# Patient Record
Sex: Male | Born: 1995
Health system: Southern US, Community
[De-identification: ages and names within clinical notes are randomized; demographics above are authoritative.]

---
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
## 2016-04-06 DIAGNOSIS — J019 Acute sinusitis, unspecified: Secondary | ICD-10-CM | POA: Diagnosis not present

## 2016-04-06 DIAGNOSIS — J3089 Other allergic rhinitis: Secondary | ICD-10-CM | POA: Diagnosis not present

## 2016-04-12 DIAGNOSIS — F988 Other specified behavioral and emotional disorders with onset usually occurring in childhood and adolescence: Secondary | ICD-10-CM | POA: Diagnosis not present

## 2016-08-30 DIAGNOSIS — F988 Other specified behavioral and emotional disorders with onset usually occurring in childhood and adolescence: Secondary | ICD-10-CM | POA: Diagnosis not present

## 2016-08-30 DIAGNOSIS — Z6820 Body mass index (BMI) 20.0-20.9, adult: Secondary | ICD-10-CM | POA: Diagnosis not present

## 2016-08-30 DIAGNOSIS — Z79899 Other long term (current) drug therapy: Secondary | ICD-10-CM | POA: Diagnosis not present

## 2016-12-07 DIAGNOSIS — Z681 Body mass index (BMI) 19 or less, adult: Secondary | ICD-10-CM | POA: Diagnosis not present

## 2016-12-07 DIAGNOSIS — Z2821 Immunization not carried out because of patient refusal: Secondary | ICD-10-CM | POA: Diagnosis not present

## 2016-12-07 DIAGNOSIS — F988 Other specified behavioral and emotional disorders with onset usually occurring in childhood and adolescence: Secondary | ICD-10-CM | POA: Diagnosis not present

## 2016-12-07 DIAGNOSIS — Z79899 Other long term (current) drug therapy: Secondary | ICD-10-CM | POA: Diagnosis not present

## 2016-12-07 DIAGNOSIS — Z1331 Encounter for screening for depression: Secondary | ICD-10-CM | POA: Diagnosis not present

## 2017-04-19 DIAGNOSIS — R509 Fever, unspecified: Secondary | ICD-10-CM | POA: Diagnosis not present

## 2017-04-19 DIAGNOSIS — M791 Myalgia, unspecified site: Secondary | ICD-10-CM | POA: Diagnosis not present

## 2017-04-19 DIAGNOSIS — B349 Viral infection, unspecified: Secondary | ICD-10-CM | POA: Diagnosis not present

## 2017-08-03 DIAGNOSIS — F988 Other specified behavioral and emotional disorders with onset usually occurring in childhood and adolescence: Secondary | ICD-10-CM | POA: Diagnosis not present

## 2017-08-03 DIAGNOSIS — Z6820 Body mass index (BMI) 20.0-20.9, adult: Secondary | ICD-10-CM | POA: Diagnosis not present

## 2017-08-03 DIAGNOSIS — Z79899 Other long term (current) drug therapy: Secondary | ICD-10-CM | POA: Diagnosis not present

## 2017-10-18 DIAGNOSIS — J019 Acute sinusitis, unspecified: Secondary | ICD-10-CM | POA: Diagnosis not present

## 2017-10-18 DIAGNOSIS — H109 Unspecified conjunctivitis: Secondary | ICD-10-CM | POA: Diagnosis not present

## 2018-11-11 ENCOUNTER — Encounter (HOSPITAL_COMMUNITY): Payer: Self-pay

## 2018-11-11 ENCOUNTER — Other Ambulatory Visit: Payer: Self-pay

## 2018-11-11 ENCOUNTER — Ambulatory Visit (HOSPITAL_COMMUNITY)
Admission: EM | Admit: 2018-11-11 | Discharge: 2018-11-11 | Disposition: A | Discharge status: Home / Self Care | Payer: BLUE CROSS/BLUE SHIELD | Attending: Emergency Medicine | Admitting: Emergency Medicine

## 2018-11-11 ENCOUNTER — Ambulatory Visit (INDEPENDENT_AMBULATORY_CARE_PROVIDER_SITE_OTHER): Payer: BLUE CROSS/BLUE SHIELD

## 2018-11-11 DIAGNOSIS — M25532 Pain in left wrist: Secondary | ICD-10-CM | POA: Diagnosis not present

## 2018-11-11 DIAGNOSIS — S60512A Abrasion of left hand, initial encounter: Secondary | ICD-10-CM

## 2018-11-11 DIAGNOSIS — S60511A Abrasion of right hand, initial encounter: Secondary | ICD-10-CM

## 2018-11-11 DIAGNOSIS — T07XXXA Unspecified multiple injuries, initial encounter: Secondary | ICD-10-CM

## 2018-11-11 MED ORDER — IBUPROFEN 800 MG PO TABS: 800.0000 mg | ORAL_TABLET | Freq: Three times a day (TID) | ORAL | 0 refills | Status: AC | PRN
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
## 2018-11-11 NOTE — Discharge Instructions (Addendum)
The x-ray of your wrist was normal.    Take the ibuprofen as prescribed.  Rest and elevate your wrist.  Apply ice packs 2-3 times a day for up to 20 minutes each.  Wear the wrist splint as needed for comfort.    Follow up with your primary care provider or an orthopedist if you symptoms continue or worsen;  Or if you develop new symptoms, such as numbness, tingling, or weakness.    Keep your wound clean and dry.  Wash it gently twice a day with soap and water.  Apply an antibiotic cream twice a day.    Return here if you see signs of infection, such as increased pain, redness, pus-like drainage, warmth, fever, chills, or other concerning symptoms.
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
## 2018-11-19 DIAGNOSIS — F988 Other specified behavioral and emotional disorders with onset usually occurring in childhood and adolescence: Secondary | ICD-10-CM | POA: Diagnosis not present

## 2018-11-19 DIAGNOSIS — Z1331 Encounter for screening for depression: Secondary | ICD-10-CM | POA: Diagnosis not present

## 2018-11-19 DIAGNOSIS — Z2821 Immunization not carried out because of patient refusal: Secondary | ICD-10-CM | POA: Diagnosis not present

## 2018-11-19 DIAGNOSIS — Z79899 Other long term (current) drug therapy: Secondary | ICD-10-CM | POA: Diagnosis not present

## 2018-11-19 DIAGNOSIS — Z6821 Body mass index (BMI) 21.0-21.9, adult: Secondary | ICD-10-CM | POA: Diagnosis not present

## 2019-03-19 DIAGNOSIS — F988 Other specified behavioral and emotional disorders with onset usually occurring in childhood and adolescence: Secondary | ICD-10-CM | POA: Diagnosis not present

## 2019-03-19 DIAGNOSIS — Z79899 Other long term (current) drug therapy: Secondary | ICD-10-CM | POA: Diagnosis not present

## 2019-03-19 DIAGNOSIS — Z2821 Immunization not carried out because of patient refusal: Secondary | ICD-10-CM | POA: Diagnosis not present

## 2019-03-19 DIAGNOSIS — Z6821 Body mass index (BMI) 21.0-21.9, adult: Secondary | ICD-10-CM | POA: Diagnosis not present

## 2019-06-27 DIAGNOSIS — Z6822 Body mass index (BMI) 22.0-22.9, adult: Secondary | ICD-10-CM | POA: Diagnosis not present

## 2019-06-27 DIAGNOSIS — F988 Other specified behavioral and emotional disorders with onset usually occurring in childhood and adolescence: Secondary | ICD-10-CM | POA: Diagnosis not present

## 2019-06-27 DIAGNOSIS — Z79899 Other long term (current) drug therapy: Secondary | ICD-10-CM | POA: Diagnosis not present

## 2019-08-21 DIAGNOSIS — R05 Cough: Secondary | ICD-10-CM | POA: Diagnosis not present

## 2019-08-21 DIAGNOSIS — Z20828 Contact with and (suspected) exposure to other viral communicable diseases: Secondary | ICD-10-CM | POA: Diagnosis not present

## 2019-08-21 DIAGNOSIS — J029 Acute pharyngitis, unspecified: Secondary | ICD-10-CM | POA: Diagnosis not present

## 2019-10-07 DIAGNOSIS — F988 Other specified behavioral and emotional disorders with onset usually occurring in childhood and adolescence: Secondary | ICD-10-CM | POA: Diagnosis not present

## 2019-10-07 DIAGNOSIS — Z79899 Other long term (current) drug therapy: Secondary | ICD-10-CM | POA: Diagnosis not present

## 2019-10-07 DIAGNOSIS — Z6822 Body mass index (BMI) 22.0-22.9, adult: Secondary | ICD-10-CM | POA: Diagnosis not present

## 2020-01-07 DIAGNOSIS — Z23 Encounter for immunization: Secondary | ICD-10-CM | POA: Diagnosis not present

## 2020-01-07 DIAGNOSIS — Z1331 Encounter for screening for depression: Secondary | ICD-10-CM | POA: Diagnosis not present

## 2020-01-07 DIAGNOSIS — F988 Other specified behavioral and emotional disorders with onset usually occurring in childhood and adolescence: Secondary | ICD-10-CM | POA: Diagnosis not present

## 2020-01-07 DIAGNOSIS — Z79899 Other long term (current) drug therapy: Secondary | ICD-10-CM | POA: Diagnosis not present

## 2020-02-27 DIAGNOSIS — Z23 Encounter for immunization: Secondary | ICD-10-CM | POA: Diagnosis not present

## 2020-11-15 IMAGING — DX DG WRIST COMPLETE 3+V*L*
4 series · 4 of 4 positions shown · non-contrast
Comparison: None.

CLINICAL DATA: Left wrist pain after injury yesterday

EXAM:
LEFT WRIST - COMPLETE 3+ VIEW

[wrist pa]
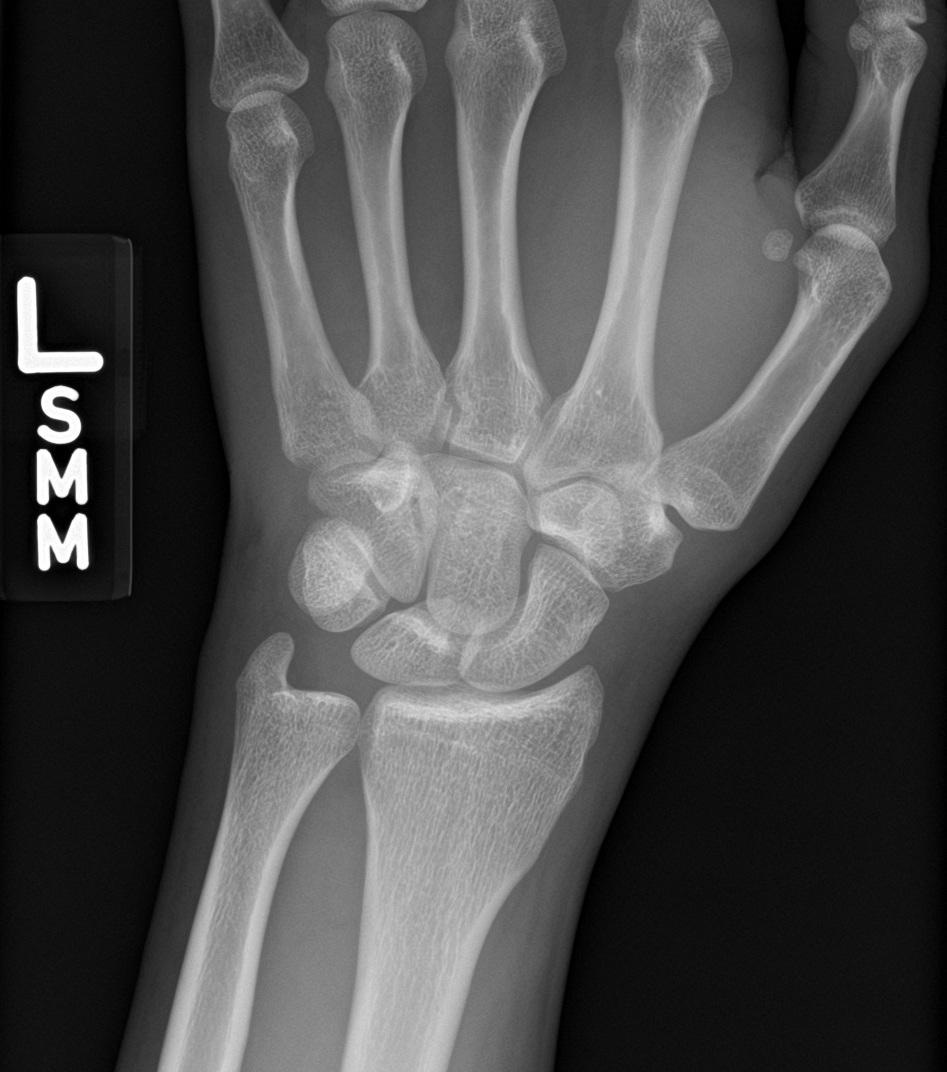

[wrist navicular]
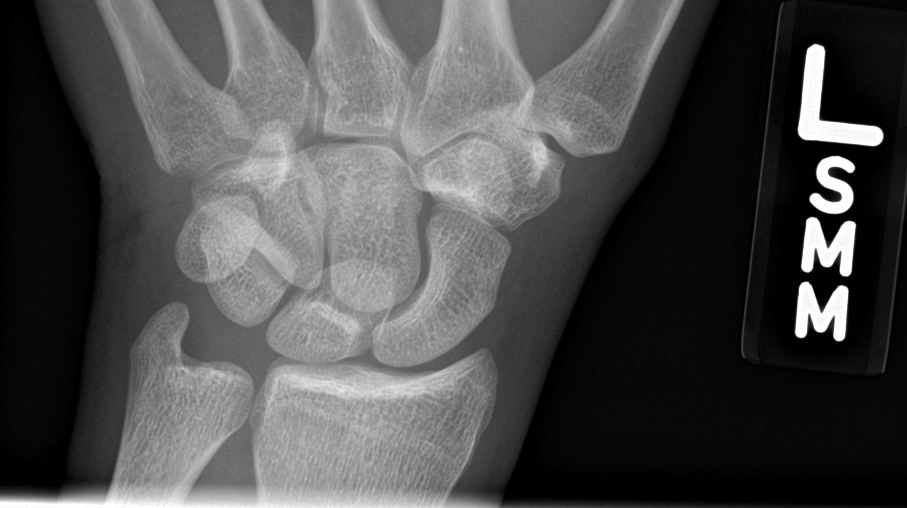

[wrist obl]
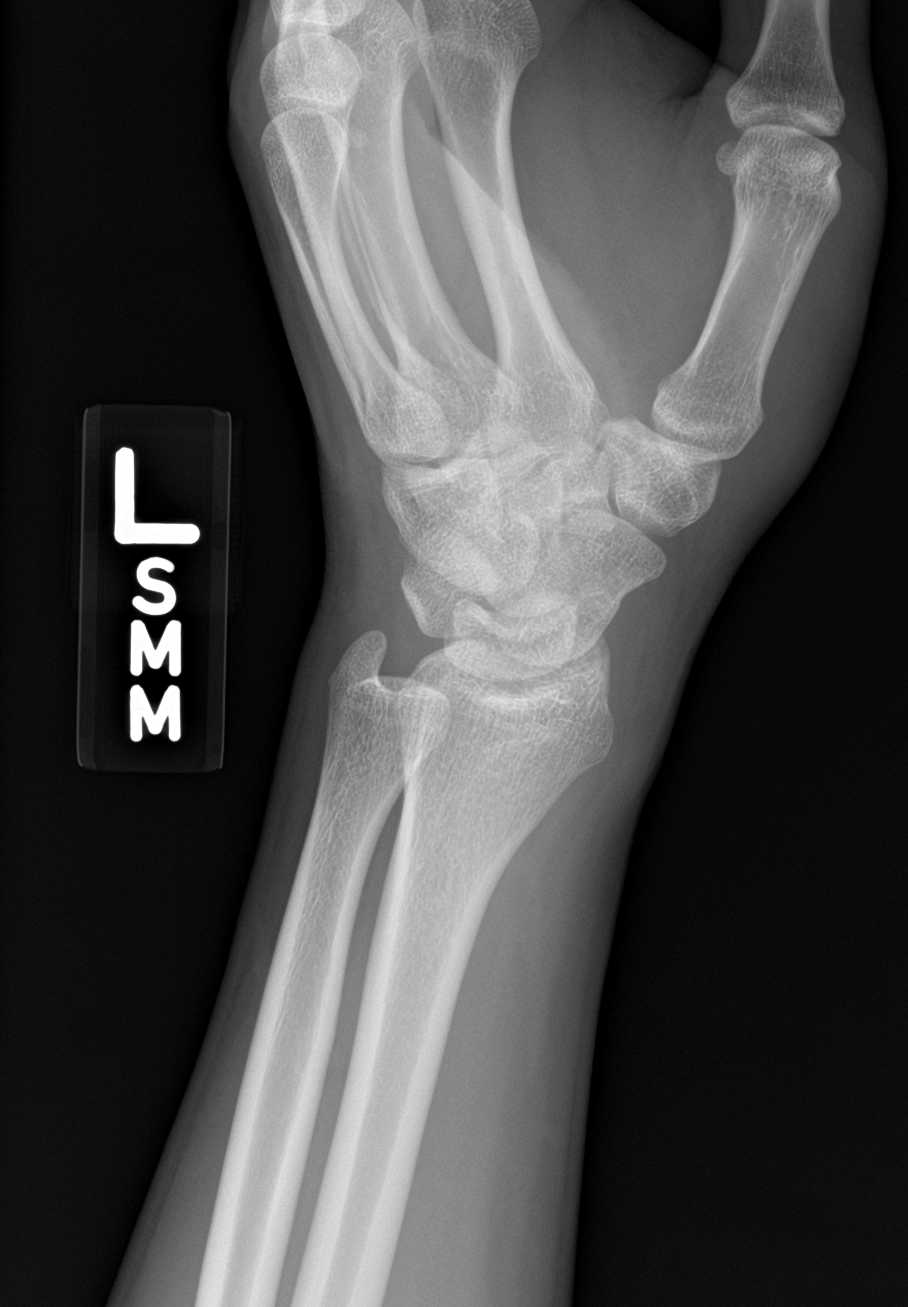

[wrist lat]
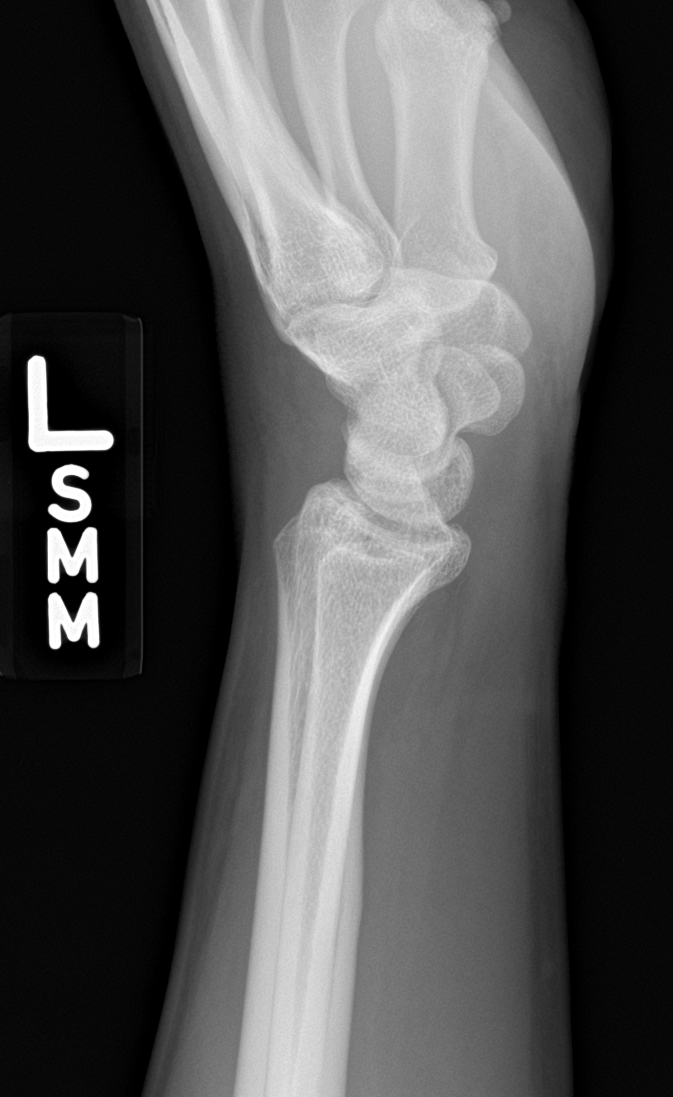

[4 of 4 positions shown; findings below may reference images not displayed]

FINDINGS: There is no evidence of fracture or dislocation. There is no
evidence of arthropathy or other focal bone abnormality. Soft
tissues are unremarkable.
IMPRESSION: Negative.

## 2021-05-17 DIAGNOSIS — J0100 Acute maxillary sinusitis, unspecified: Secondary | ICD-10-CM | POA: Diagnosis not present

## 2021-11-26 DIAGNOSIS — J3489 Other specified disorders of nose and nasal sinuses: Secondary | ICD-10-CM | POA: Diagnosis not present

## 2022-02-14 DIAGNOSIS — Z6826 Body mass index (BMI) 26.0-26.9, adult: Secondary | ICD-10-CM | POA: Diagnosis not present

## 2022-02-14 DIAGNOSIS — R059 Cough, unspecified: Secondary | ICD-10-CM | POA: Diagnosis not present

## 2022-02-16 DIAGNOSIS — J069 Acute upper respiratory infection, unspecified: Secondary | ICD-10-CM | POA: Diagnosis not present

## 2022-02-16 DIAGNOSIS — U071 COVID-19: Secondary | ICD-10-CM | POA: Diagnosis not present

## 2022-03-29 DIAGNOSIS — L739 Follicular disorder, unspecified: Secondary | ICD-10-CM | POA: Diagnosis not present

## 2022-03-29 DIAGNOSIS — Z6825 Body mass index (BMI) 25.0-25.9, adult: Secondary | ICD-10-CM | POA: Diagnosis not present
